# Patient Record
Sex: Female | Born: 1970 | Race: White | Hispanic: No | Smoking: Never smoker
Health system: Southern US, Community
[De-identification: ages and names within clinical notes are randomized; demographics above are authoritative.]

---

## 2011-09-15 ENCOUNTER — Ambulatory Visit (INDEPENDENT_AMBULATORY_CARE_PROVIDER_SITE_OTHER): Payer: BC Managed Care – PPO | Admitting: Emergency Medicine

## 2011-09-15 ENCOUNTER — Ambulatory Visit: Payer: BC Managed Care – PPO

## 2011-09-15 ENCOUNTER — Other Ambulatory Visit: Payer: Self-pay | Admitting: Emergency Medicine

## 2011-09-15 VITALS — BP 166/98 | HR 56 | Temp 98.2°F | Resp 16 | Ht 70.0 in | Wt 180.0 lb

## 2011-09-15 DIAGNOSIS — S99929A Unspecified injury of unspecified foot, initial encounter: Secondary | ICD-10-CM

## 2011-09-15 DIAGNOSIS — W19XXXA Unspecified fall, initial encounter: Secondary | ICD-10-CM

## 2011-09-15 DIAGNOSIS — S93409A Sprain of unspecified ligament of unspecified ankle, initial encounter: Secondary | ICD-10-CM

## 2011-09-15 NOTE — Progress Notes (Signed)
   Date:  09/15/2011   Name:  Carol Morrow   DOB:  02/04/1970   MRN:  [id number] Gender: female  Age: 41 y.o.  PCP:  Ignatius Specking., MD    Chief Complaint: Foot Injury   History of Present Illness:  Carol Morrow is a 41 y.o. pleasant patient who presents with the following:  93 week old injury to left foot and ankle when she fell suffering an inversion injury.  Never treated and still having pain and swelling in lateral ankle.  Given a boot by a friend and is improving.  Denies other complaints.  There is no problem list on file for this patient.   No past medical history on file.  No past surgical history on file.  History  Substance Use Topics  . Smoking status: Never Smoker   . Smokeless tobacco: Not on file  . Alcohol Use: Not on file    No family history on file.  No Known Allergies  Medication list has been reviewed and updated.  Current Outpatient Prescriptions on File Prior to Visit  Medication Sig Dispense Refill  . levothyroxine (SYNTHROID, LEVOTHROID) 100 MCG tablet Take 100 mcg by mouth daily.      Marland Kitchen triamterene-hydrochlorothiazide (MAXZIDE-25) 37.5-25 MG per tablet Take 1 tablet by mouth daily. 1/2 tablet daily        Review of Systems:  As per HPI, otherwise negative.    Physical Examination: Filed Vitals:   09/15/11 1621  BP: 166/98  Pulse: 56  Temp: 98.2 F (36.8 C)  Resp: 16   Filed Vitals:   09/15/11 1621  Height: 5\' 10"  (1.778 m)  Weight: 180 lb (81.647 kg)   Body mass index is 25.83 kg/(m^2). Ideal Body Weight: Weight in (lb) to have BMI = 25: 173.9    GEN: WDWN, NAD, Non-toxic, Alert & Oriented x 3 HEENT: Atraumatic, Normocephalic.  Ears and Nose: No external deformity. EXTR: No clubbing/cyanosis/edema.  Tender and swollen lateral ankle.  No deformity some ecchymosis NEURO: Normal gait.  PSYCH: Normally interactive. Conversant. Not depressed or anxious appearing.  Calm demeanor.    Assessment and Plan: Sprain  ankle Boot No zumba or kick boxing for one week. NSAID of choice Follow up if not improved  Carmelina Dane, MD   [facility] reading (PRIMARY) by  Dr. Dareen Piano.  negative.

## 2018-06-27 ENCOUNTER — Other Ambulatory Visit: Payer: Self-pay | Admitting: Internal Medicine

## 2018-06-27 DIAGNOSIS — N6489 Other specified disorders of breast: Secondary | ICD-10-CM

## 2018-07-06 ENCOUNTER — Ambulatory Visit
Admission: RE | Admit: 2018-07-06 | Discharge: 2018-07-06 | Disposition: A | Discharge status: Home / Self Care | Payer: BLUE CROSS/BLUE SHIELD | Source: Ambulatory Visit | Attending: Internal Medicine | Admitting: Internal Medicine

## 2018-07-06 ENCOUNTER — Other Ambulatory Visit: Payer: Self-pay

## 2018-07-06 DIAGNOSIS — N6489 Other specified disorders of breast: Secondary | ICD-10-CM

## 2019-10-17 ENCOUNTER — Other Ambulatory Visit: Payer: Self-pay

## 2019-10-17 ENCOUNTER — Ambulatory Visit
Admission: EM | Admit: 2019-10-17 | Discharge: 2019-10-17 | Disposition: A | Discharge status: Home / Self Care | Payer: 59 | Attending: Family Medicine | Admitting: Family Medicine

## 2019-10-17 DIAGNOSIS — Z20822 Contact with and (suspected) exposure to covid-19: Secondary | ICD-10-CM | POA: Insufficient documentation

## 2019-10-17 LAB — SARS CORONAVIRUS 2 (TAT 6-24 HRS): SARS Coronavirus 2: NEGATIVE

## 2019-10-17 NOTE — Discharge Instructions (Signed)
COVID-19: What Your Test Results Mean If you test positive for COVID-19 Take steps to help prevent the spread of COVID-19 Stay home.  Do not leave your home, except to get medical care. Do not visit public areas. Get rest and stay hydrated. Take over-the-counter medicines, such as acetaminophen, to help you feel better. Stay in touch with your doctor. Separate yourself from other people.  As much as possible, stay in a specific room and away from other people and pets in your home. If you test negative for COVID-19  You probably were not infected at the time your sample was collected.  However, that does not mean you will not get sick.  It is possible that you were very early in your infection when your sample was collected and that you could test positive later. A negative test result does not mean you won't get sick later. cdc.gov/coronavirus 06/30/2018 This information is not intended to replace advice given to you by your health care provider. Make sure you discuss any questions you have with your health care provider. Document Revised: 01/03/2019 Document Reviewed: 01/03/2019 Elsevier Patient Education  2020 Elsevier Inc.  

## 2019-10-17 NOTE — ED Triage Notes (Signed)
Pt reports she was exposed to COVID at work. No symptoms

## 2020-04-10 ENCOUNTER — Other Ambulatory Visit: Payer: Self-pay | Admitting: Internal Medicine

## 2020-04-10 DIAGNOSIS — Z1231 Encounter for screening mammogram for malignant neoplasm of breast: Secondary | ICD-10-CM

## 2020-05-21 ENCOUNTER — Ambulatory Visit
Admission: RE | Admit: 2020-05-21 | Discharge: 2020-05-21 | Disposition: A | Discharge status: Home / Self Care | Payer: 59 | Source: Ambulatory Visit | Attending: Internal Medicine | Admitting: Internal Medicine

## 2020-05-21 ENCOUNTER — Other Ambulatory Visit: Payer: Self-pay

## 2020-05-21 DIAGNOSIS — Z1231 Encounter for screening mammogram for malignant neoplasm of breast: Secondary | ICD-10-CM

## 2021-05-10 ENCOUNTER — Other Ambulatory Visit (HOSPITAL_BASED_OUTPATIENT_CLINIC_OR_DEPARTMENT_OTHER): Payer: Self-pay

## 2021-05-10 ENCOUNTER — Other Ambulatory Visit (HOSPITAL_BASED_OUTPATIENT_CLINIC_OR_DEPARTMENT_OTHER): Payer: Self-pay | Admitting: Internal Medicine

## 2021-05-10 ENCOUNTER — Other Ambulatory Visit: Payer: Self-pay | Admitting: Internal Medicine

## 2021-05-10 DIAGNOSIS — Z1231 Encounter for screening mammogram for malignant neoplasm of breast: Secondary | ICD-10-CM

## 2021-05-21 ENCOUNTER — Encounter (HOSPITAL_BASED_OUTPATIENT_CLINIC_OR_DEPARTMENT_OTHER): Payer: Self-pay | Admitting: Radiology

## 2021-05-21 ENCOUNTER — Ambulatory Visit (HOSPITAL_BASED_OUTPATIENT_CLINIC_OR_DEPARTMENT_OTHER)
Admission: RE | Admit: 2021-05-21 | Discharge: 2021-05-21 | Disposition: A | Discharge status: Home / Self Care | Payer: 59 | Source: Ambulatory Visit | Attending: Internal Medicine | Admitting: Internal Medicine

## 2021-05-21 DIAGNOSIS — Z1231 Encounter for screening mammogram for malignant neoplasm of breast: Secondary | ICD-10-CM | POA: Insufficient documentation

## 2022-01-07 DIAGNOSIS — L910 Hypertrophic scar: Secondary | ICD-10-CM | POA: Diagnosis not present

## 2022-01-07 DIAGNOSIS — D225 Melanocytic nevi of trunk: Secondary | ICD-10-CM | POA: Diagnosis not present

## 2022-01-07 DIAGNOSIS — Z85828 Personal history of other malignant neoplasm of skin: Secondary | ICD-10-CM | POA: Diagnosis not present

## 2022-01-07 DIAGNOSIS — L298 Other pruritus: Secondary | ICD-10-CM | POA: Diagnosis not present

## 2022-01-07 DIAGNOSIS — L578 Other skin changes due to chronic exposure to nonionizing radiation: Secondary | ICD-10-CM | POA: Diagnosis not present

## 2022-04-08 ENCOUNTER — Other Ambulatory Visit: Payer: Self-pay | Admitting: Internal Medicine

## 2022-04-08 DIAGNOSIS — Z1231 Encounter for screening mammogram for malignant neoplasm of breast: Secondary | ICD-10-CM

## 2022-04-22 DIAGNOSIS — Z79899 Other long term (current) drug therapy: Secondary | ICD-10-CM | POA: Diagnosis not present

## 2022-04-22 DIAGNOSIS — E7800 Pure hypercholesterolemia, unspecified: Secondary | ICD-10-CM | POA: Diagnosis not present

## 2022-04-22 DIAGNOSIS — E039 Hypothyroidism, unspecified: Secondary | ICD-10-CM | POA: Diagnosis not present

## 2022-04-22 DIAGNOSIS — Z0000 Encounter for general adult medical examination without abnormal findings: Secondary | ICD-10-CM | POA: Diagnosis not present

## 2022-05-27 ENCOUNTER — Ambulatory Visit
Admission: RE | Admit: 2022-05-27 | Discharge: 2022-05-27 | Disposition: A | Discharge status: Home / Self Care | Payer: 59 | Source: Ambulatory Visit

## 2022-05-27 DIAGNOSIS — Z1231 Encounter for screening mammogram for malignant neoplasm of breast: Secondary | ICD-10-CM

## 2022-07-21 DIAGNOSIS — Z299 Encounter for prophylactic measures, unspecified: Secondary | ICD-10-CM | POA: Diagnosis not present

## 2022-07-21 DIAGNOSIS — I10 Essential (primary) hypertension: Secondary | ICD-10-CM | POA: Diagnosis not present

## 2022-07-21 DIAGNOSIS — R232 Flushing: Secondary | ICD-10-CM | POA: Diagnosis not present

## 2022-07-22 DIAGNOSIS — L578 Other skin changes due to chronic exposure to nonionizing radiation: Secondary | ICD-10-CM | POA: Diagnosis not present

## 2022-07-22 DIAGNOSIS — Z85828 Personal history of other malignant neoplasm of skin: Secondary | ICD-10-CM | POA: Diagnosis not present

## 2022-07-22 DIAGNOSIS — L910 Hypertrophic scar: Secondary | ICD-10-CM | POA: Diagnosis not present

## 2022-09-16 DIAGNOSIS — R5383 Other fatigue: Secondary | ICD-10-CM | POA: Diagnosis not present

## 2022-09-16 DIAGNOSIS — Z780 Asymptomatic menopausal state: Secondary | ICD-10-CM | POA: Diagnosis not present

## 2022-09-23 DIAGNOSIS — Z1211 Encounter for screening for malignant neoplasm of colon: Secondary | ICD-10-CM | POA: Diagnosis not present

## 2022-09-23 DIAGNOSIS — K648 Other hemorrhoids: Secondary | ICD-10-CM | POA: Diagnosis not present

## 2022-10-21 DIAGNOSIS — Z299 Encounter for prophylactic measures, unspecified: Secondary | ICD-10-CM | POA: Diagnosis not present

## 2022-10-21 DIAGNOSIS — E6609 Other obesity due to excess calories: Secondary | ICD-10-CM | POA: Diagnosis not present

## 2022-10-21 DIAGNOSIS — I10 Essential (primary) hypertension: Secondary | ICD-10-CM | POA: Diagnosis not present

## 2022-10-21 DIAGNOSIS — R5383 Other fatigue: Secondary | ICD-10-CM | POA: Diagnosis not present

## 2022-10-21 DIAGNOSIS — Z23 Encounter for immunization: Secondary | ICD-10-CM | POA: Diagnosis not present

## 2023-04-14 ENCOUNTER — Other Ambulatory Visit: Payer: Self-pay | Admitting: Internal Medicine

## 2023-04-14 DIAGNOSIS — Z1231 Encounter for screening mammogram for malignant neoplasm of breast: Secondary | ICD-10-CM

## 2023-04-28 ENCOUNTER — Other Ambulatory Visit: Payer: Self-pay | Admitting: Internal Medicine

## 2023-04-28 ENCOUNTER — Encounter: Payer: Self-pay | Admitting: Internal Medicine

## 2023-04-28 DIAGNOSIS — E7800 Pure hypercholesterolemia, unspecified: Secondary | ICD-10-CM | POA: Diagnosis not present

## 2023-04-28 DIAGNOSIS — N6320 Unspecified lump in the left breast, unspecified quadrant: Secondary | ICD-10-CM

## 2023-04-28 DIAGNOSIS — Z0000 Encounter for general adult medical examination without abnormal findings: Secondary | ICD-10-CM | POA: Diagnosis not present

## 2023-04-28 DIAGNOSIS — R5383 Other fatigue: Secondary | ICD-10-CM | POA: Diagnosis not present

## 2023-04-28 DIAGNOSIS — Z79899 Other long term (current) drug therapy: Secondary | ICD-10-CM | POA: Diagnosis not present

## 2023-04-28 DIAGNOSIS — Z01419 Encounter for gynecological examination (general) (routine) without abnormal findings: Secondary | ICD-10-CM | POA: Diagnosis not present

## 2023-05-05 ENCOUNTER — Encounter: Payer: Self-pay | Admitting: Podiatry

## 2023-05-05 ENCOUNTER — Ambulatory Visit: Payer: Self-pay | Admitting: Podiatry

## 2023-05-05 ENCOUNTER — Ambulatory Visit (INDEPENDENT_AMBULATORY_CARE_PROVIDER_SITE_OTHER)

## 2023-05-05 VITALS — Ht 70.0 in | Wt 180.0 lb

## 2023-05-05 DIAGNOSIS — M722 Plantar fascial fibromatosis: Secondary | ICD-10-CM

## 2023-05-05 DIAGNOSIS — B351 Tinea unguium: Secondary | ICD-10-CM | POA: Diagnosis not present

## 2023-05-05 DIAGNOSIS — M79671 Pain in right foot: Secondary | ICD-10-CM

## 2023-05-05 MED ORDER — MELOXICAM 15 MG PO TABS: 15.0000 mg | ORAL_TABLET | Freq: Every day | ORAL | 1 refills | Status: AC

## 2023-05-05 MED ORDER — METHYLPREDNISOLONE 4 MG PO TBPK: ORAL_TABLET | ORAL | 0 refills | Status: AC

## 2023-05-05 MED ORDER — TERBINAFINE HCL 250 MG PO TABS: 250.0000 mg | ORAL_TABLET | Freq: Every day | ORAL | 0 refills | Status: DC

## 2023-05-05 NOTE — Progress Notes (Signed)
   Chief Complaint  Patient presents with   Nail Problem    Patient is here for heel pain and nail fungus    Subjective: 53 y.o. female presenting today for 2 separate complaints.  Patient has been experiencing right heel pain now for a few months.  Gradual onset.  No history of injury.  Has not done anything for treatment  Has also noticed discoloration of the toenails and concern for toenail fungus.   History reviewed. No pertinent past medical history.  History reviewed. No pertinent surgical history.  No Known Allergies   05/05/2023  Objective: Physical Exam General: The patient is alert and oriented x3 in no acute distress.  Dermatology: Skin is warm, dry and supple bilateral lower extremities. Negative for open lesions or macerations bilateral.  Hyperkeratotic dystrophic discolored nails noted to the distal tips of the nail plates bilateral  Vascular: Dorsalis Pedis and Posterior Tibial pulses palpable bilateral.  Capillary fill time is immediate to all digits.  Neurological: Grossly intact via light touch  Musculoskeletal: Tenderness to palpation to the plantar aspect of the right heel along the plantar fascia. All other joints range of motion within normal limits bilateral. Strength 5/5 in all groups bilateral.   Radiographic exam RT foot 05/05/2023: Normal osseous mineralization. Joint spaces preserved. No fracture/dislocation/boney destruction. No other soft tissue abnormalities or radiopaque foreign bodies.   Assessment: 1. Plantar fasciitis right 2.  Fungal nail infection bilateral  Plan of Care:  -Patient evaluated. Xrays reviewed.   -Injection of 0.5cc Celestone  soluspan injected into the right plantar fascia  -Rx for Medrol  Dose Pack placed -Rx for Meloxicam  ordered for patient. -Instructed patient regarding therapies and modalities at home to alleviate symptoms.  -Today we discussed the pathology and etiology of fungal nail infection.  Different modalities  were also discussed including associated risks and benefits and relative efficacies.  The patient opts for oral antifungal treatment motivation  -Prescription for Lamisil  to 50 mg #90 daily.  Denies a history of liver pathology or symptoms -Return to clinic in 4 weeks.    Dental asst at [facility] Dot Gazella, DPM [facility]  Dr. Dot Gazella, DPM    [address] [place], [place] [address]                Office [phone]  Fax [phone]

## 2023-05-10 ENCOUNTER — Other Ambulatory Visit: Payer: Self-pay | Admitting: Internal Medicine

## 2023-05-10 ENCOUNTER — Ambulatory Visit
Admission: RE | Admit: 2023-05-10 | Discharge: 2023-05-10 | Disposition: A | Discharge status: Home / Self Care | Source: Ambulatory Visit | Attending: Internal Medicine | Admitting: Internal Medicine

## 2023-05-10 DIAGNOSIS — N6320 Unspecified lump in the left breast, unspecified quadrant: Secondary | ICD-10-CM

## 2023-05-10 DIAGNOSIS — N6321 Unspecified lump in the left breast, upper outer quadrant: Secondary | ICD-10-CM | POA: Diagnosis not present

## 2023-05-23 DIAGNOSIS — B351 Tinea unguium: Secondary | ICD-10-CM | POA: Diagnosis not present

## 2023-05-23 DIAGNOSIS — M722 Plantar fascial fibromatosis: Secondary | ICD-10-CM | POA: Diagnosis not present

## 2023-05-23 MED ORDER — BETAMETHASONE SOD PHOS & ACET 6 (3-3) MG/ML IJ SUSP
3.0000 mg | Freq: Once | INTRAMUSCULAR | Status: AC
  Administered 2023-05-23: 3 mg via INTRA_ARTICULAR

## 2023-06-02 ENCOUNTER — Ambulatory Visit: Admitting: Podiatry

## 2023-06-02 ENCOUNTER — Encounter: Payer: Self-pay | Admitting: Podiatry

## 2023-06-02 ENCOUNTER — Ambulatory Visit

## 2023-06-02 DIAGNOSIS — M722 Plantar fascial fibromatosis: Secondary | ICD-10-CM | POA: Diagnosis not present

## 2023-06-02 DIAGNOSIS — B351 Tinea unguium: Secondary | ICD-10-CM

## 2023-06-02 MED ORDER — BETAMETHASONE SOD PHOS & ACET 6 (3-3) MG/ML IJ SUSP
3.0000 mg | Freq: Once | INTRAMUSCULAR | Status: AC
  Administered 2023-06-02: 3 mg via INTRA_ARTICULAR

## 2023-06-02 NOTE — Progress Notes (Signed)
   Chief Complaint  Patient presents with   Plantar Fasciitis    "For two weeks it was great.  Now it's back but not as bad as before."    Subjective: 53 y.o. female presenting for follow-up evaluation of plantar fasciitis to the right heel.  Patient states that for the first 2 weeks it was feeling great however the pain has slowly returned.  It is not as bad as it was with initial presentation.  She has been taking her meloxicam  as instructed.  Patient also is currently taking the oral Lamisil  prescribed for fungal nail infection.  No new complaints   No past medical history on file.  No past surgical history on file.  No Known Allergies   05/05/2023  Objective: Physical Exam General: The patient is alert and oriented x3 in no acute distress.  Dermatology: Unchanged.  Skin is warm, dry and supple bilateral lower extremities. Negative for open lesions or macerations bilateral.  Hyperkeratotic dystrophic discolored nails noted to the distal tips of the nail plates bilateral  Vascular: Dorsalis Pedis and Posterior Tibial pulses palpable bilateral.  Capillary fill time is immediate to all digits.  Neurological: Grossly intact via light touch  Musculoskeletal: There continues to be some tenderness to palpation to the plantar aspect of the right heel along the plantar fascia. All other joints range of motion within normal limits bilateral. Strength 5/5 in all groups bilateral.   Radiographic exam RT foot 05/05/2023: Normal osseous mineralization. Joint spaces preserved. No fracture/dislocation/boney destruction. No other soft tissue abnormalities or radiopaque foreign bodies.   Assessment: 1. Plantar fasciitis right 2.  Fungal nail infection bilateral  Plan of Care:  -Patient evaluated.  -Injection of 0.5cc Celestone  soluspan injected into the right plantar fascia  - Continue meloxicam  15 mg daily - Continue wearing [employer] shoes at work and [employer] at home.  Patient  states that she does not really wear tennis shoes.  She does not go barefoot - Continue oral Lamisil  250mg  daily as prescribed -Return to clinic in 4 weeks.    *Dental asst at [facility]  Dot Gazella, DPM [facility]  Dr. Dot Gazella, DPM    [address] [place], [place] [address]                Office [phone]  Fax [phone]

## 2023-06-30 ENCOUNTER — Encounter: Payer: Self-pay | Admitting: Podiatry

## 2023-06-30 ENCOUNTER — Ambulatory Visit: Admitting: Podiatry

## 2023-06-30 VITALS — Ht 70.0 in | Wt 180.0 lb

## 2023-06-30 DIAGNOSIS — M722 Plantar fascial fibromatosis: Secondary | ICD-10-CM | POA: Diagnosis not present

## 2023-06-30 NOTE — Progress Notes (Signed)
   Chief Complaint  Patient presents with   Plantar Fasciitis    Pt is here to f/u on right foot due to plantar fasciitis she states her foot is getting better but not 100% yet.    Subjective: 53 y.o. female presenting for follow-up evaluation of plantar fasciitis to the right heel.  Significant improvement.  Patient also is currently taking the oral Lamisil  prescribed for fungal nail infection.  No new complaints   No past medical history on file.  No past surgical history on file.  No Known Allergies   05/05/2023  Objective: Physical Exam General: The patient is alert and oriented x3 in no acute distress.  Dermatology: Unchanged.  Skin is warm, dry and supple bilateral lower extremities. Negative for open lesions or macerations bilateral.  Hyperkeratotic dystrophic discolored nails noted to the distal tips of the nail plates bilateral  Vascular: Dorsalis Pedis and Posterior Tibial pulses palpable bilateral.  Capillary fill time is immediate to all digits.  Neurological: Grossly intact via light touch  Musculoskeletal: There continues very mild  to palpation to the plantar aspect of the right heel along the plantar fascia. All other joints range of motion within normal limits bilateral. Strength 5/5 in all groups bilateral.   Radiographic exam RT foot 05/05/2023: Normal osseous mineralization. Joint spaces preserved. No fracture/dislocation/boney destruction. No other soft tissue abnormalities or radiopaque foreign bodies.   Assessment: 1. Plantar fasciitis right 2.  Fungal nail infection bilateral  Plan of Care:  -Patient evaluated.  -Injection of 0.5cc Celestone  soluspan injected into the right plantar fascia  - Continue meloxicam  15 mg daily PRN - Continue wearing [employer] shoes at work and [employer] at home.  Patient states that she does not really wear tennis shoes.  She does not go barefoot - Continue oral Lamisil  250mg  daily as prescribed -Return to clinic in 4  weeks.    *Dental asst at [facility]  Dot Gazella, DPM [facility]  Dr. Dot Gazella, DPM    [address] [place], [place] [address]                Office [phone]  Fax [phone]

## 2023-07-16 DIAGNOSIS — M722 Plantar fascial fibromatosis: Secondary | ICD-10-CM | POA: Diagnosis not present

## 2023-07-16 MED ORDER — BETAMETHASONE SOD PHOS & ACET 6 (3-3) MG/ML IJ SUSP
3.0000 mg | Freq: Once | INTRAMUSCULAR | Status: AC
  Administered 2023-07-16: 3 mg via INTRA_ARTICULAR

## 2023-07-29 ENCOUNTER — Other Ambulatory Visit: Payer: Self-pay | Admitting: Podiatry

## 2023-07-31 ENCOUNTER — Other Ambulatory Visit: Payer: Self-pay | Admitting: Podiatry

## 2023-07-31 DIAGNOSIS — Z79899 Other long term (current) drug therapy: Secondary | ICD-10-CM

## 2023-07-31 NOTE — Progress Notes (Signed)
 In order placed for updated hepatic function panel.  If WNL we will go ahead and refill the Lamisil  250 mg #90 daily  Thresa EMERSON Sar, DPM [facility]  Dr. Thresa EMERSON Sar, DPM    [address] [place], [place] [address]                Office [phone]  Fax [phone]

## 2023-08-08 ENCOUNTER — Other Ambulatory Visit: Payer: Self-pay | Admitting: Podiatry

## 2023-08-10 ENCOUNTER — Telehealth: Payer: Self-pay | Admitting: Podiatry

## 2023-08-10 NOTE — Telephone Encounter (Signed)
 The patient is requesting a refill for Terbinafine  (LAMISIL ) 250 mg tablets. Does she need to have labs done prior to the refill? If so, can the lab order be sent to [facility] in [place]?

## 2023-08-11 DIAGNOSIS — Z79899 Other long term (current) drug therapy: Secondary | ICD-10-CM | POA: Diagnosis not present

## 2023-08-11 NOTE — Telephone Encounter (Signed)
 Left message on machine advising patient to get lab work done before refill can be sent.

## 2023-08-12 LAB — HEPATIC FUNCTION PANEL
ALT: 28 IU/L (ref 0–32)
AST: 22 IU/L (ref 0–40)
Albumin: 4.1 g/dL (ref 3.8–4.9)
Alkaline Phosphatase: 90 IU/L (ref 44–121)
Bilirubin Total: 0.3 mg/dL (ref 0.0–1.2)
Bilirubin, Direct: 0.13 mg/dL (ref 0.00–0.40)
Total Protein: 6.4 g/dL (ref 6.0–8.5)

## 2023-08-16 ENCOUNTER — Telehealth: Payer: Self-pay | Admitting: Podiatry

## 2023-08-16 NOTE — Telephone Encounter (Signed)
 Patient asking if blood work is back and to see if you are going to prescribe medicine?

## 2023-08-18 ENCOUNTER — Telehealth: Payer: Self-pay | Admitting: Podiatry

## 2023-08-18 ENCOUNTER — Other Ambulatory Visit: Payer: Self-pay | Admitting: Emergency Medicine

## 2023-08-18 MED ORDER — TERBINAFINE HCL 250 MG PO TABS: 250.0000 mg | ORAL_TABLET | Freq: Every day | ORAL | 0 refills | Status: DC

## 2023-08-18 NOTE — Telephone Encounter (Signed)
 Patient called asking for a refill of her lamisil . Patient had labwork done last Friday.

## 2023-08-22 ENCOUNTER — Other Ambulatory Visit: Payer: Self-pay | Admitting: Podiatry

## 2023-08-22 MED ORDER — TERBINAFINE HCL 250 MG PO TABS: 250.0000 mg | ORAL_TABLET | Freq: Every day | ORAL | 0 refills | Status: DC

## 2023-08-22 NOTE — Progress Notes (Signed)
 LFT 08/15/23 WNL  Thresa EMERSON Sar, DPM [facility]  Dr. Thresa EMERSON Sar, DPM    [address] [place], [place] [address]                Office [phone]  Fax [phone]

## 2023-08-31 DIAGNOSIS — Z85828 Personal history of other malignant neoplasm of skin: Secondary | ICD-10-CM | POA: Diagnosis not present

## 2023-08-31 DIAGNOSIS — L578 Other skin changes due to chronic exposure to nonionizing radiation: Secondary | ICD-10-CM | POA: Diagnosis not present

## 2023-11-03 ENCOUNTER — Ambulatory Visit: Admitting: Podiatry

## 2023-11-14 ENCOUNTER — Ambulatory Visit: Admitting: Podiatry

## 2023-11-17 ENCOUNTER — Ambulatory Visit: Admitting: Podiatry

## 2023-11-17 ENCOUNTER — Encounter: Payer: Self-pay | Admitting: Podiatry

## 2023-11-17 VITALS — Ht 70.0 in | Wt 180.0 lb

## 2023-11-17 DIAGNOSIS — B351 Tinea unguium: Secondary | ICD-10-CM

## 2023-11-17 MED ORDER — CICLOPIROX 8 % EX SOLN: Freq: Every day | CUTANEOUS | 2 refills | Status: AC

## 2023-11-17 MED ORDER — TERBINAFINE HCL 250 MG PO TABS: 250.0000 mg | ORAL_TABLET | Freq: Every day | ORAL | 0 refills | Status: AC

## 2023-11-17 NOTE — Progress Notes (Signed)
   Chief Complaint  Patient presents with   Nail Problem    Pt is here to f/u on bilateral great toenails due to fungus she states her nails looks the same as before no changes, continues to take Lamisil  as prescribed.    Subjective: 53 y.o. female presenting for follow-up evaluation of plantar fasciitis to the right heel.  Significant improvement.  Patient also is currently taking the oral Lamisil  prescribed for fungal nail infection.  No new complaints   No past medical history on file.  No past surgical history on file.  No Known Allergies   05/05/2023  Objective: Physical Exam General: The patient is alert and oriented x3 in no acute distress.  Dermatology: Unchanged.  Skin is warm, dry and supple bilateral lower extremities. Negative for open lesions or macerations bilateral.  Hyperkeratotic dystrophic discolored nails noted to the distal tips of the nail plates bilateral  Vascular: Dorsalis Pedis and Posterior Tibial pulses palpable bilateral.  Capillary fill time is immediate to all digits.  Neurological: Grossly intact via light touch  Musculoskeletal: There continues very mild  to palpation to the plantar aspect of the right heel along the plantar fascia. All other joints range of motion within normal limits bilateral. Strength 5/5 in all groups bilateral.   Radiographic exam RT foot 05/05/2023: Normal osseous mineralization. Joint spaces preserved. No fracture/dislocation/boney destruction. No other soft tissue abnormalities or radiopaque foreign bodies.   Assessment: 1. Plantar fasciitis right 2.  Fungal nail infection bilateral  Plan of Care:  -Patient evaluated.  -Injection of 0.5cc Celestone  soluspan injected into the right plantar fascia  - Continue meloxicam  15 mg daily PRN - Continue wearing [employer] shoes at work and [employer] at home.  Patient states that she does not really wear tennis shoes.  She does not go barefoot - Continue oral Lamisil  250mg   daily as prescribed -Return to clinic in 4 weeks.    *Dental asst at [facility]  Thresa EMERSON Sar, DPM [facility]  Dr. Thresa EMERSON Sar, DPM    [address] [place], [place] [address]                Office [phone]  Fax [phone]

## 2024-02-22 IMAGING — MG MM DIGITAL SCREENING BILAT W/ TOMO AND CAD
8 series · 8 of 24 positions shown · non-contrast
Comparison: Previous exam(s).

CLINICAL DATA: Screening.

EXAM:
DIGITAL SCREENING BILATERAL MAMMOGRAM WITH TOMOSYNTHESIS AND CAD
TECHNIQUE: Bilateral screening digital craniocaudal and mediolateral oblique
mammograms were obtained. Bilateral screening digital breast
tomosynthesis was performed. The images were evaluated with
computer-aided detection.

[R CC synth-2D]
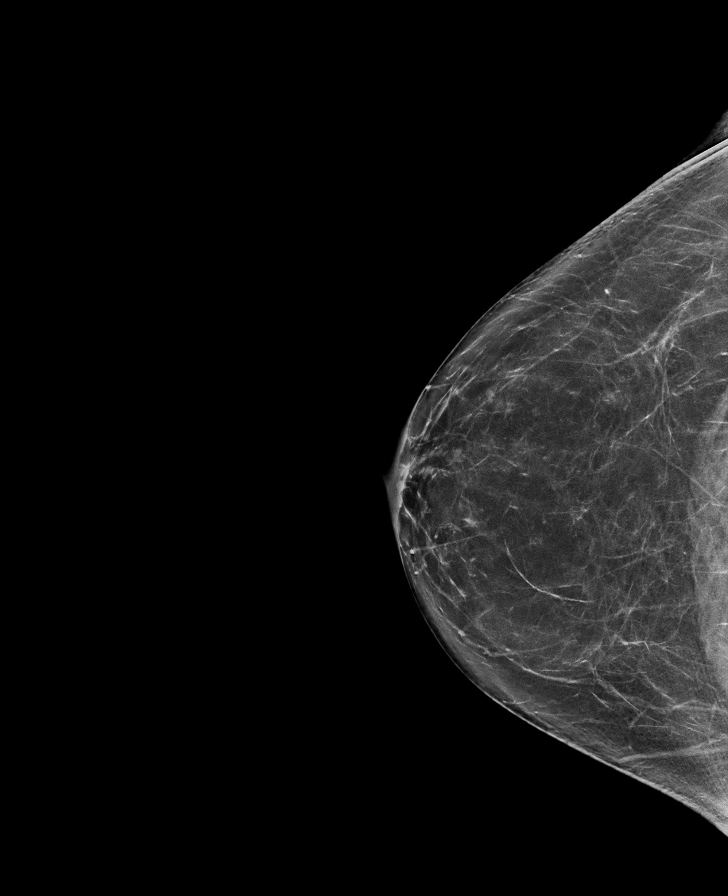

[R MLO synth-2D]
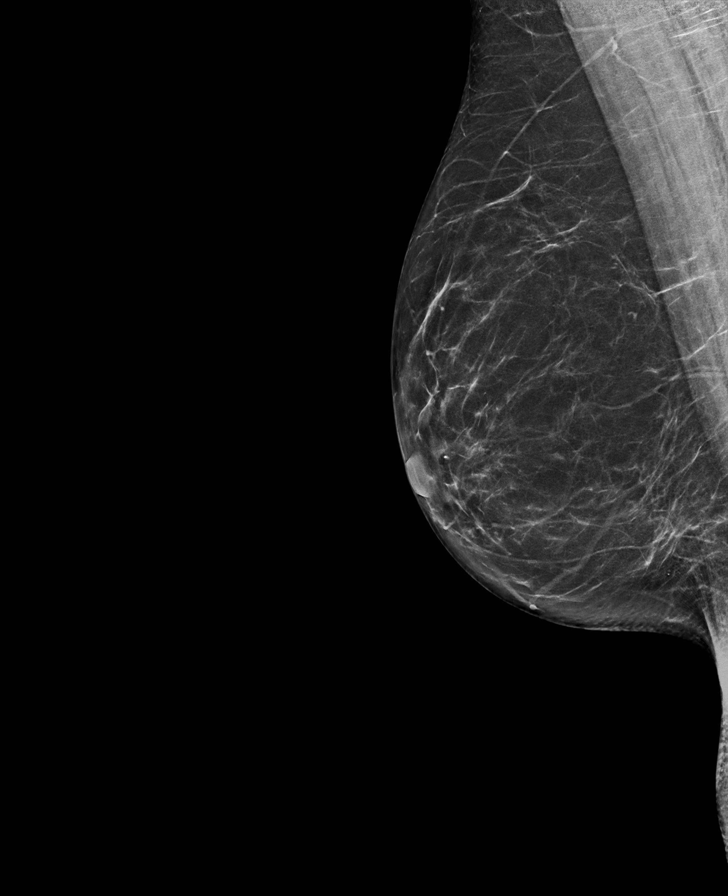

[L MLO synth-2D]
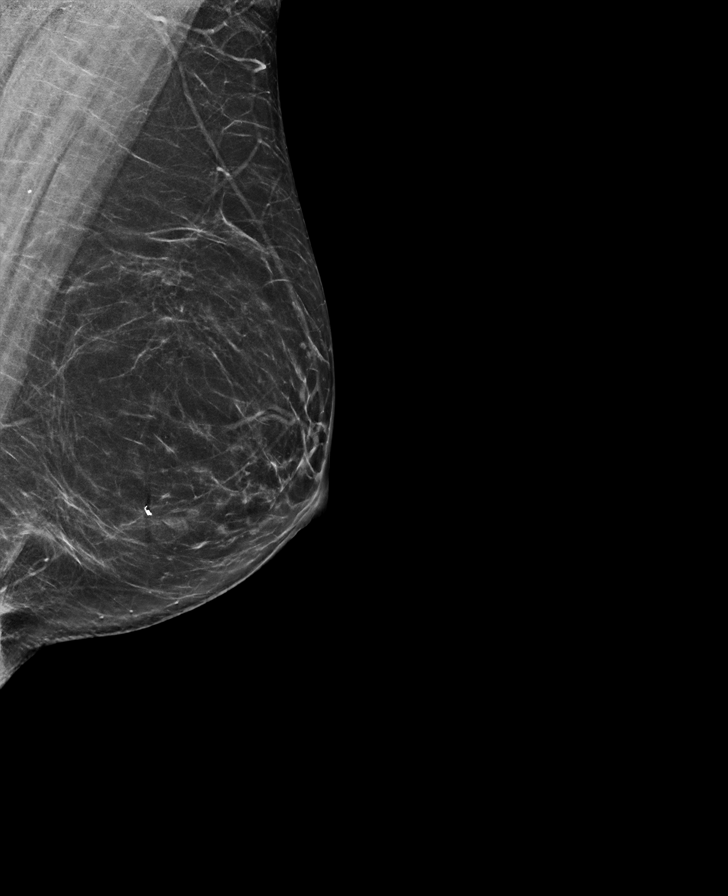

[L CC synth-2D]
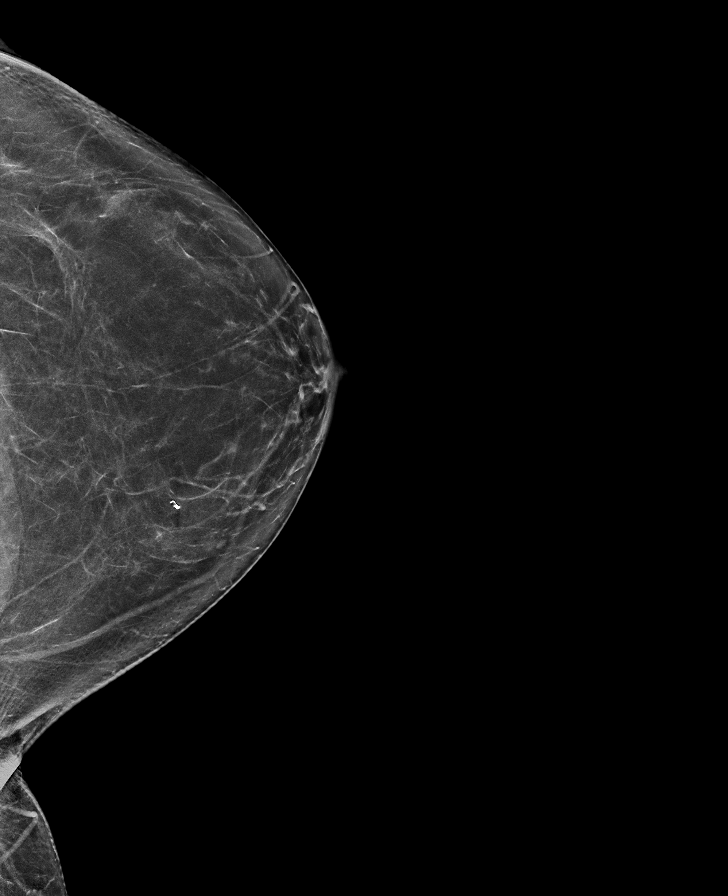

[R CC tomo · tomo slice 37/73.0]
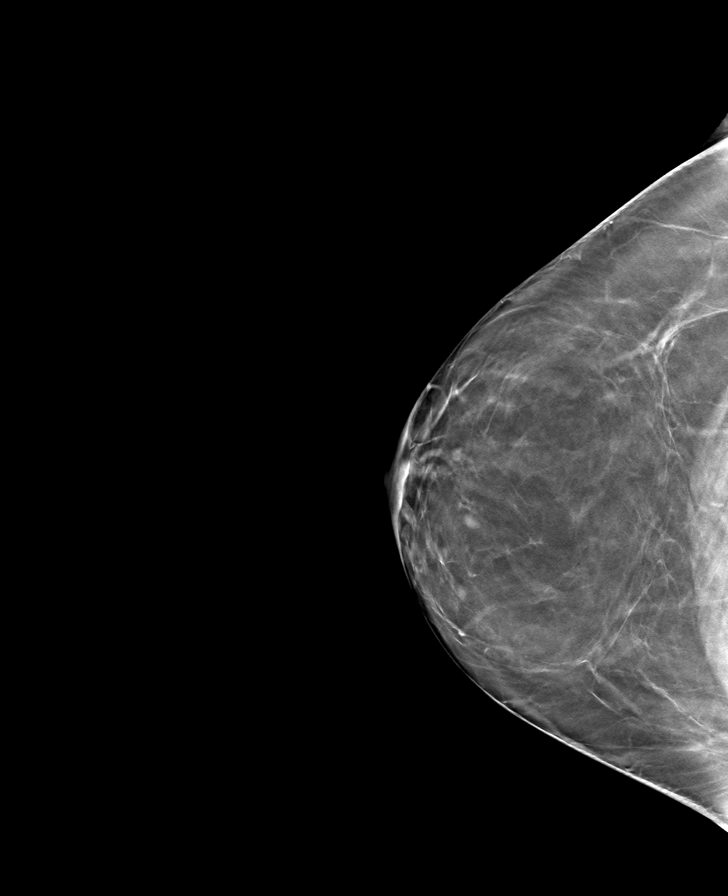

[R MLO tomo · tomo slice 36/71.0]
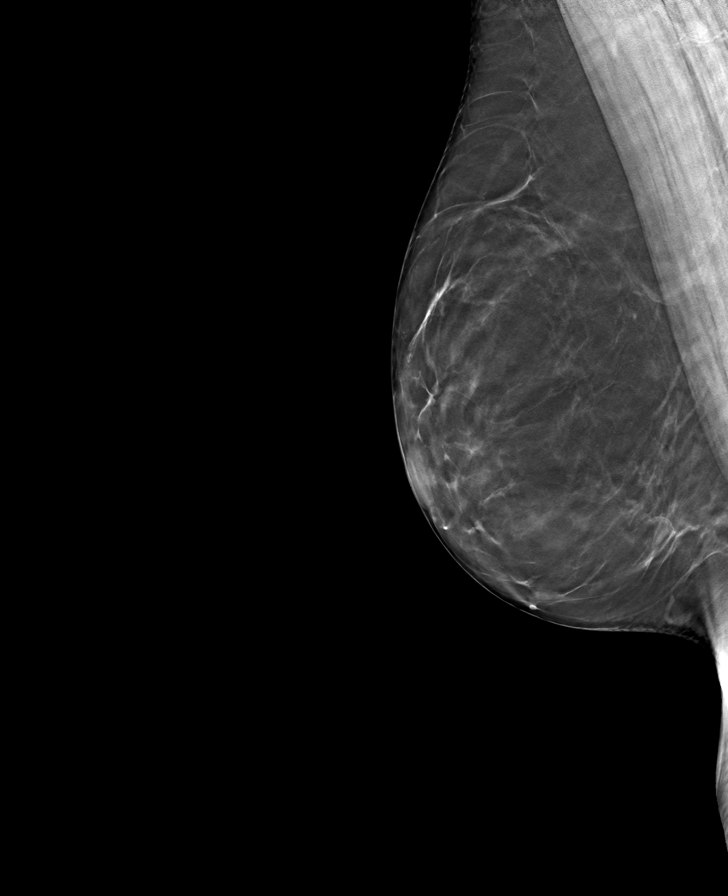

[L CC tomo · tomo slice 36/71.0]
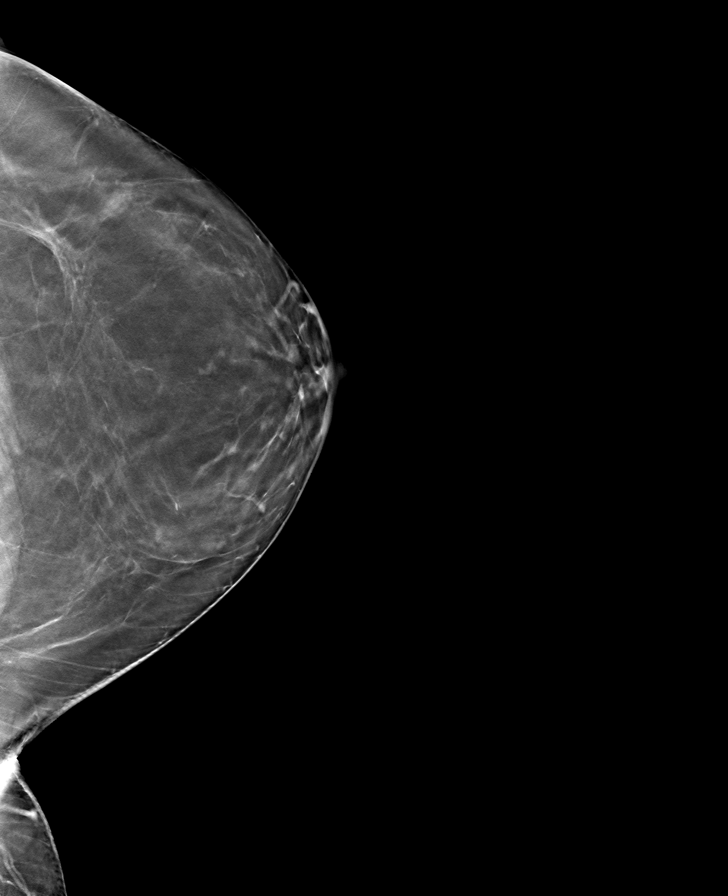

[L MLO tomo · tomo slice 35/70.0]
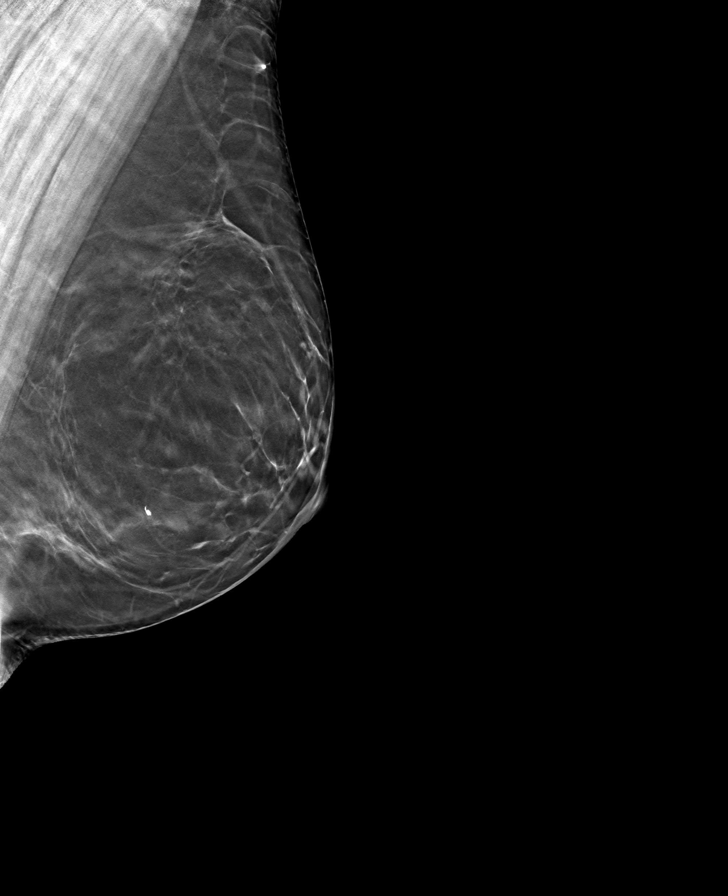

[8 of 24 positions shown; findings below may reference images not displayed]

ACR Breast Density Category b: There are scattered areas of
fibroglandular density.
FINDINGS: There are no findings suspicious for malignancy.
IMPRESSION: No mammographic evidence of malignancy. A result letter of this
screening mammogram will be mailed directly to the patient.

RECOMMENDATION:
Screening mammogram in one year. (Code:51-O-LD2)

BI-RADS CATEGORY  1: Negative.

## 2024-05-10 ENCOUNTER — Ambulatory Visit: Admitting: Podiatry
